# Patient Record
Sex: Female | Born: 1955 | Race: Black or African American | Hispanic: No | Marital: Married | Smoking: Never smoker
Health system: Southern US, Community
[De-identification: ages and names within clinical notes are randomized; demographics above are authoritative.]

## PROBLEM LIST (undated history)

## (undated) HISTORY — PX: CHOLECYSTECTOMY: SHX55

---

## 2004-05-16 ENCOUNTER — Ambulatory Visit (HOSPITAL_COMMUNITY): Admission: RE | Admit: 2004-05-16 | Discharge: 2004-05-16 | Payer: Self-pay | Admitting: Obstetrics and Gynecology

## 2004-06-24 ENCOUNTER — Encounter: Admission: RE | Admit: 2004-06-24 | Discharge: 2004-06-24 | Payer: Self-pay | Admitting: Family Medicine

## 2005-05-17 ENCOUNTER — Ambulatory Visit (HOSPITAL_COMMUNITY): Admission: RE | Admit: 2005-05-17 | Discharge: 2005-05-17 | Payer: Self-pay | Admitting: Obstetrics and Gynecology

## 2005-06-15 ENCOUNTER — Encounter: Admission: RE | Admit: 2005-06-15 | Discharge: 2005-06-15 | Payer: Self-pay | Admitting: Obstetrics and Gynecology

## 2006-07-18 ENCOUNTER — Encounter: Admission: RE | Admit: 2006-07-18 | Discharge: 2006-07-18 | Payer: Self-pay | Admitting: Obstetrics and Gynecology

## 2007-08-02 ENCOUNTER — Encounter: Admission: RE | Admit: 2007-08-02 | Discharge: 2007-08-02 | Payer: Self-pay | Admitting: Obstetrics and Gynecology

## 2008-08-04 ENCOUNTER — Ambulatory Visit: Payer: Self-pay | Admitting: Diagnostic Radiology

## 2008-08-04 ENCOUNTER — Ambulatory Visit (HOSPITAL_BASED_OUTPATIENT_CLINIC_OR_DEPARTMENT_OTHER): Admission: RE | Admit: 2008-08-04 | Discharge: 2008-08-04 | Payer: Self-pay | Admitting: Obstetrics and Gynecology

## 2009-03-17 ENCOUNTER — Ambulatory Visit: Payer: Self-pay | Admitting: Diagnostic Radiology

## 2009-03-17 ENCOUNTER — Emergency Department (HOSPITAL_BASED_OUTPATIENT_CLINIC_OR_DEPARTMENT_OTHER): Admission: EM | Admit: 2009-03-17 | Discharge: 2009-03-17 | Payer: Self-pay | Admitting: Emergency Medicine

## 2009-08-10 ENCOUNTER — Ambulatory Visit: Payer: Self-pay | Admitting: Diagnostic Radiology

## 2009-08-10 ENCOUNTER — Ambulatory Visit (HOSPITAL_BASED_OUTPATIENT_CLINIC_OR_DEPARTMENT_OTHER): Admission: RE | Admit: 2009-08-10 | Discharge: 2009-08-10 | Payer: Self-pay | Admitting: Obstetrics and Gynecology

## 2010-02-13 ENCOUNTER — Encounter: Payer: Self-pay | Admitting: Obstetrics and Gynecology

## 2010-03-23 ENCOUNTER — Encounter (HOSPITAL_COMMUNITY)
Admission: RE | Admit: 2010-03-23 | Discharge: 2010-03-23 | Disposition: A | Discharge status: Home / Self Care | Payer: Managed Care, Other (non HMO) | Source: Ambulatory Visit | Attending: Obstetrics and Gynecology | Admitting: Obstetrics and Gynecology

## 2010-03-23 DIAGNOSIS — Z01812 Encounter for preprocedural laboratory examination: Secondary | ICD-10-CM | POA: Insufficient documentation

## 2010-03-23 LAB — SURGICAL PCR SCREEN: Staphylococcus aureus: INVALID — AB

## 2010-03-23 LAB — CBC
MCHC: 32.2 g/dL (ref 30.0–36.0)
Platelets: 335 10*3/uL (ref 150–400)
WBC: 4.7 10*3/uL (ref 4.0–10.5)

## 2010-03-26 LAB — MRSA CULTURE

## 2010-03-28 ENCOUNTER — Other Ambulatory Visit: Payer: Self-pay | Admitting: Obstetrics and Gynecology

## 2010-03-28 ENCOUNTER — Inpatient Hospital Stay (HOSPITAL_COMMUNITY)
Admission: RE | Admit: 2010-03-28 | Discharge: 2010-03-29 | DRG: 743 | Disposition: A | Discharge status: Home / Self Care | Payer: Managed Care, Other (non HMO) | Source: Ambulatory Visit | Attending: Obstetrics and Gynecology | Admitting: Obstetrics and Gynecology

## 2010-03-28 DIAGNOSIS — D252 Subserosal leiomyoma of uterus: Secondary | ICD-10-CM | POA: Diagnosis present

## 2010-03-28 DIAGNOSIS — D649 Anemia, unspecified: Secondary | ICD-10-CM | POA: Diagnosis present

## 2010-03-28 DIAGNOSIS — D251 Intramural leiomyoma of uterus: Principal | ICD-10-CM | POA: Diagnosis present

## 2010-03-28 DIAGNOSIS — N920 Excessive and frequent menstruation with regular cycle: Secondary | ICD-10-CM | POA: Diagnosis present

## 2010-03-28 LAB — CBC
Hemoglobin: 11 g/dL — ABNORMAL LOW (ref 12.0–15.0)
MCH: 30.1 pg (ref 26.0–34.0)
MCHC: 31.9 g/dL (ref 30.0–36.0)
Platelets: 279 10*3/uL (ref 150–400)
RBC: 3.66 MIL/uL — ABNORMAL LOW (ref 3.87–5.11)

## 2010-03-29 LAB — CBC
HCT: 31.6 % — ABNORMAL LOW (ref 36.0–46.0)
MCH: 30.2 pg (ref 26.0–34.0)
MCV: 93.5 fL (ref 78.0–100.0)
RBC: 3.38 MIL/uL — ABNORMAL LOW (ref 3.87–5.11)
WBC: 9.3 10*3/uL (ref 4.0–10.5)

## 2010-03-29 NOTE — Op Note (Addendum)
NAME:  Debbie Duncan, Debbie Duncan NO.:  [id number]  MEDICAL RECORD NO.:  [id number]           PATIENT TYPE:  I  LOCATION:  9317                          FACILITY:  [facility]  PHYSICIAN:  Arlyce Harman, MD     DATE OF BIRTH:  [birth year]  DATE OF PROCEDURE:  03/28/2010 DATE OF DISCHARGE:                              OPERATIVE REPORT   PREOPERATIVE DIAGNOSES:  Fibroids, menorrhagia, anemia.  POSTOPERATIVE DIAGNOSES:  Fibroids, menorrhagia, anemia.  SURGEON:  Arlyce Harman, MD  ASSISTANT:  Pieter Partridge, MD.  COMPLICATIONS:  None.  PROCEDURE:  Abdominal hysterectomy and examine under anesthesia.  FINDINGS:  Enlarged fibroid uterus.  SPECIMENS:  Uterus and cervix.  DISPOSITION:  Sent to Pathology.  ESTIMATED BLOOD LOSS:  200-400 mL.  INDICATIONS AND CONSENT:  The patient is a 55 year old parous female who has an enlarging fibroid uterus.  In addition she has had worsening menorrhagia and is anemic.  Hemoglobin was 9 on last week and the patient was started on high doses of iron and iron rich diet.  Preop hemoglobin was 11.  Options for treatment were thoroughly discussed with the patient, and the patient gave informed consent for total vaginal hysterectomy versus total abdominal hysterectomy.  Plan is to do exam under anesthesia before surgery and then determine which would be the best operative procedure.  The patient gave informed consent for both procedures.  PROCEDURE:  The patient was placed on the table in a supine position. General anesthesia was induced.  She was then placed in the dorsal lithotomy position, and the Foley catheter was inserted after the patient had been prepped and draped in the usual fashion.  A speculum was inserted into the vagina and the cervix presented, attempt was made to pull the cervix down, and it did not come all the way down to the introitus.  There was very little prolapse.  A bimanual was performed and the uterus was  palpated as to approximately 10 to 12 cm and retroverted, I could not feel the top of the fundus.  Therefore, the decision was made to proceed abdominally.  At this point, the single-tooth tenaculum was removed from the cervix, and the patient was placed in a supine position and then we changed gloves and went above.  The abdomen was entered using a Pfannenstiel technique, and the abdomen was entered carefully without difficulty cauterizing the small bleeders to the sac.  The patient is a TE[employer] teacher [employer] and refuses whole blood; however, she did consent for fragments if medically necessary.  The abdomen was entered without difficulty and the enlarged uterus did present and it was retroverted, large and globular.  The bowel was packed away and a suture was placed in the uterine fundus for traction and the left round ligament was identified and a Tresa Endo was placed on the proximal portion followed by a curved Haney.  The pedicle was incised using the Bovie, and then it was tied using 0 Vicryl.  Next, the anterior leaf of the broad ligament was entered and reflected downward to push the bladder down anteriorly.  The incision was extended using the  Metzenbaum scissors to the other side of the uterus and then the other round ligament was clamped, cut, and tied in a similar fashion, and the bladder was reflected downward.  The adnexal pedicle on the left was then clamped, cut, and doubly tied, followed by the right side, and we continued to work our way down until we got to the uterine vessels which were clamped, cut, and tied bilaterally.  Moist straight Heaney was placed on the cardinal ligaments and these were clamped, cut, and tied bilaterally and then a curved Haney was placed at the margin of the cervix bilaterally and the cuff was incised and using the Jorgenson scissors, the cuff was completely incised to free up the uterus and the cervix and this was handed off.  Richardson angle  stitches were placed bilaterally and then the cuff was closed using interrupted figure-of- eight sutures.  The abdomen was then irrigated and suctioned out. Hemostasis was excellent.  At this point, a suture was placed in the top of the cuff.  The bladder plicated to cover, the bladder flap was used to cover the exposed vaginal cuff incision and support the cul-de-sac. The pelvis was deep with an enlarged uterus, retroverted prior to its removal.  Again, hemostasis was noted as excellent.  On the left side, the adnexal pedicle was plicated to the round ligament.  This was not done on the right because the adnexal pedicle was well supported on that side.  At this point, all the instruments were removed and pedicles checked which were dry then the peritoneum was then closed using 0 Vicryl in a running fashion.  Sponge and instrument counts were correct. Next, the fascia was closed using 0 Vicryl in a running locked fashion. The subcu was closed with 0 plain in a running fashion, and the skin was approximated with 4-0 Vicryl sewing in a subcuticular fashion.  At this point, the procedure was terminated.  The patient was awakened and taken to recovery in good condition.     Arlyce Harman, MD     EG/MEDQ  D:  03/28/2010  T:  03/29/2010  Job:  [id number]  Electronically Signed by Arlyce Harman MD on 03/29/2010 07:43:20 PM

## 2010-04-13 LAB — CBC
Hemoglobin: 10.2 g/dL — ABNORMAL LOW (ref 12.0–15.0)
MCHC: 33.6 g/dL (ref 30.0–36.0)
Platelets: 308 10*3/uL (ref 150–400)
RBC: 3.49 MIL/uL — ABNORMAL LOW (ref 3.87–5.11)

## 2010-04-13 LAB — COMPREHENSIVE METABOLIC PANEL
AST: 55 U/L — ABNORMAL HIGH (ref 0–37)
Alkaline Phosphatase: 73 U/L (ref 39–117)
CO2: 27 mEq/L (ref 19–32)
Calcium: 9.3 mg/dL (ref 8.4–10.5)
Chloride: 105 mEq/L (ref 96–112)
Creatinine, Ser: 0.9 mg/dL (ref 0.4–1.2)
Glucose, Bld: 83 mg/dL (ref 70–99)
Potassium: 5.4 mEq/L — ABNORMAL HIGH (ref 3.5–5.1)
Total Bilirubin: 0.9 mg/dL (ref 0.3–1.2)
Total Protein: 8.5 g/dL — ABNORMAL HIGH (ref 6.0–8.3)

## 2010-04-13 LAB — DIFFERENTIAL
Basophils Relative: 2 % — ABNORMAL HIGH (ref 0–1)
Monocytes Absolute: 0.4 10*3/uL (ref 0.1–1.0)

## 2010-04-13 LAB — POCT CARDIAC MARKERS
Myoglobin, poc: 43.4 ng/mL (ref 12–200)
Troponin i, poc: <0.05 ng/mL (ref 0.00–0.09)

## 2010-07-26 ENCOUNTER — Other Ambulatory Visit: Payer: Self-pay | Admitting: Obstetrics and Gynecology

## 2010-07-26 DIAGNOSIS — Z1231 Encounter for screening mammogram for malignant neoplasm of breast: Secondary | ICD-10-CM

## 2010-08-12 ENCOUNTER — Ambulatory Visit (HOSPITAL_BASED_OUTPATIENT_CLINIC_OR_DEPARTMENT_OTHER)
Admission: RE | Admit: 2010-08-12 | Discharge: 2010-08-12 | Disposition: A | Discharge status: Home / Self Care | Payer: Managed Care, Other (non HMO) | Source: Ambulatory Visit | Attending: Obstetrics and Gynecology | Admitting: Obstetrics and Gynecology

## 2010-08-12 DIAGNOSIS — Z1231 Encounter for screening mammogram for malignant neoplasm of breast: Secondary | ICD-10-CM | POA: Insufficient documentation

## 2011-07-31 ENCOUNTER — Other Ambulatory Visit: Payer: Self-pay | Admitting: Obstetrics and Gynecology

## 2011-07-31 DIAGNOSIS — Z1231 Encounter for screening mammogram for malignant neoplasm of breast: Secondary | ICD-10-CM

## 2011-08-14 ENCOUNTER — Ambulatory Visit (HOSPITAL_BASED_OUTPATIENT_CLINIC_OR_DEPARTMENT_OTHER): Payer: Managed Care, Other (non HMO)

## 2011-08-16 ENCOUNTER — Ambulatory Visit (HOSPITAL_BASED_OUTPATIENT_CLINIC_OR_DEPARTMENT_OTHER)
Admission: RE | Admit: 2011-08-16 | Discharge: 2011-08-16 | Disposition: A | Discharge status: Home / Self Care | Payer: Managed Care, Other (non HMO) | Source: Ambulatory Visit | Attending: Obstetrics and Gynecology | Admitting: Obstetrics and Gynecology

## 2011-08-16 DIAGNOSIS — Z1231 Encounter for screening mammogram for malignant neoplasm of breast: Secondary | ICD-10-CM | POA: Insufficient documentation

## 2012-07-29 ENCOUNTER — Ambulatory Visit (HOSPITAL_BASED_OUTPATIENT_CLINIC_OR_DEPARTMENT_OTHER)
Admission: RE | Admit: 2012-07-29 | Discharge: 2012-07-29 | Disposition: A | Discharge status: Home / Self Care | Payer: Managed Care, Other (non HMO) | Source: Ambulatory Visit | Attending: Obstetrics and Gynecology | Admitting: Obstetrics and Gynecology

## 2012-07-29 ENCOUNTER — Other Ambulatory Visit (HOSPITAL_BASED_OUTPATIENT_CLINIC_OR_DEPARTMENT_OTHER): Payer: Self-pay | Admitting: *Deleted

## 2012-07-29 ENCOUNTER — Other Ambulatory Visit: Payer: Self-pay | Admitting: Obstetrics and Gynecology

## 2012-07-29 DIAGNOSIS — Z1231 Encounter for screening mammogram for malignant neoplasm of breast: Secondary | ICD-10-CM

## 2012-07-29 DIAGNOSIS — N189 Chronic kidney disease, unspecified: Secondary | ICD-10-CM | POA: Insufficient documentation

## 2012-07-29 DIAGNOSIS — N182 Chronic kidney disease, stage 2 (mild): Secondary | ICD-10-CM

## 2012-08-19 ENCOUNTER — Ambulatory Visit (HOSPITAL_BASED_OUTPATIENT_CLINIC_OR_DEPARTMENT_OTHER)
Admission: RE | Admit: 2012-08-19 | Discharge: 2012-08-19 | Disposition: A | Discharge status: Home / Self Care | Payer: Managed Care, Other (non HMO) | Source: Ambulatory Visit | Attending: Obstetrics and Gynecology | Admitting: Obstetrics and Gynecology

## 2012-08-19 DIAGNOSIS — Z1231 Encounter for screening mammogram for malignant neoplasm of breast: Secondary | ICD-10-CM | POA: Insufficient documentation

## 2013-08-13 ENCOUNTER — Other Ambulatory Visit: Payer: Self-pay | Admitting: Obstetrics and Gynecology

## 2013-08-13 DIAGNOSIS — Z1231 Encounter for screening mammogram for malignant neoplasm of breast: Secondary | ICD-10-CM

## 2013-08-20 ENCOUNTER — Ambulatory Visit (HOSPITAL_BASED_OUTPATIENT_CLINIC_OR_DEPARTMENT_OTHER)
Admission: RE | Admit: 2013-08-20 | Discharge: 2013-08-20 | Disposition: A | Discharge status: Home / Self Care | Payer: Managed Care, Other (non HMO) | Source: Ambulatory Visit | Attending: Obstetrics and Gynecology | Admitting: Obstetrics and Gynecology

## 2013-08-20 DIAGNOSIS — Z1231 Encounter for screening mammogram for malignant neoplasm of breast: Secondary | ICD-10-CM

## 2014-10-08 ENCOUNTER — Other Ambulatory Visit (HOSPITAL_BASED_OUTPATIENT_CLINIC_OR_DEPARTMENT_OTHER): Payer: Self-pay | Admitting: Family Medicine

## 2014-10-08 DIAGNOSIS — Z1231 Encounter for screening mammogram for malignant neoplasm of breast: Secondary | ICD-10-CM

## 2014-10-12 ENCOUNTER — Ambulatory Visit (HOSPITAL_BASED_OUTPATIENT_CLINIC_OR_DEPARTMENT_OTHER)
Admission: RE | Admit: 2014-10-12 | Discharge: 2014-10-12 | Disposition: A | Discharge status: Home / Self Care | Payer: BLUE CROSS/BLUE SHIELD | Source: Ambulatory Visit | Attending: Family Medicine | Admitting: Family Medicine

## 2014-10-12 DIAGNOSIS — Z1231 Encounter for screening mammogram for malignant neoplasm of breast: Secondary | ICD-10-CM | POA: Insufficient documentation

## 2015-10-12 ENCOUNTER — Other Ambulatory Visit: Payer: Self-pay | Admitting: Obstetrics and Gynecology

## 2015-10-12 DIAGNOSIS — Z1231 Encounter for screening mammogram for malignant neoplasm of breast: Secondary | ICD-10-CM

## 2015-10-19 ENCOUNTER — Ambulatory Visit (HOSPITAL_BASED_OUTPATIENT_CLINIC_OR_DEPARTMENT_OTHER)
Admission: RE | Admit: 2015-10-19 | Discharge: 2015-10-19 | Disposition: A | Discharge status: Home / Self Care | Payer: BLUE CROSS/BLUE SHIELD | Source: Ambulatory Visit | Attending: Obstetrics and Gynecology | Admitting: Obstetrics and Gynecology

## 2015-10-19 DIAGNOSIS — Z1231 Encounter for screening mammogram for malignant neoplasm of breast: Secondary | ICD-10-CM | POA: Diagnosis not present

## 2015-10-19 DIAGNOSIS — R928 Other abnormal and inconclusive findings on diagnostic imaging of breast: Secondary | ICD-10-CM | POA: Diagnosis not present

## 2015-10-25 ENCOUNTER — Other Ambulatory Visit: Payer: Self-pay | Admitting: Obstetrics and Gynecology

## 2015-10-25 DIAGNOSIS — R928 Other abnormal and inconclusive findings on diagnostic imaging of breast: Secondary | ICD-10-CM

## 2015-10-28 ENCOUNTER — Ambulatory Visit
Admission: RE | Admit: 2015-10-28 | Discharge: 2015-10-28 | Disposition: A | Discharge status: Home / Self Care | Payer: BLUE CROSS/BLUE SHIELD | Source: Ambulatory Visit | Attending: Obstetrics and Gynecology | Admitting: Obstetrics and Gynecology

## 2015-10-28 DIAGNOSIS — R928 Other abnormal and inconclusive findings on diagnostic imaging of breast: Secondary | ICD-10-CM

## 2016-10-30 ENCOUNTER — Other Ambulatory Visit: Payer: Self-pay | Admitting: Obstetrics and Gynecology

## 2016-10-30 DIAGNOSIS — Z1231 Encounter for screening mammogram for malignant neoplasm of breast: Secondary | ICD-10-CM

## 2016-11-02 ENCOUNTER — Ambulatory Visit (HOSPITAL_BASED_OUTPATIENT_CLINIC_OR_DEPARTMENT_OTHER): Payer: BLUE CROSS/BLUE SHIELD

## 2016-11-16 ENCOUNTER — Ambulatory Visit (HOSPITAL_BASED_OUTPATIENT_CLINIC_OR_DEPARTMENT_OTHER)
Admission: RE | Admit: 2016-11-16 | Discharge: 2016-11-16 | Disposition: A | Discharge status: Home / Self Care | Payer: BLUE CROSS/BLUE SHIELD | Source: Ambulatory Visit | Attending: Obstetrics and Gynecology | Admitting: Obstetrics and Gynecology

## 2016-11-16 DIAGNOSIS — Z1231 Encounter for screening mammogram for malignant neoplasm of breast: Secondary | ICD-10-CM | POA: Diagnosis present

## 2017-10-10 ENCOUNTER — Other Ambulatory Visit: Payer: Self-pay | Admitting: Obstetrics and Gynecology

## 2017-10-10 DIAGNOSIS — Z1231 Encounter for screening mammogram for malignant neoplasm of breast: Secondary | ICD-10-CM

## 2017-11-20 ENCOUNTER — Ambulatory Visit (HOSPITAL_BASED_OUTPATIENT_CLINIC_OR_DEPARTMENT_OTHER)
Admission: RE | Admit: 2017-11-20 | Discharge: 2017-11-20 | Disposition: A | Discharge status: Home / Self Care | Payer: BLUE CROSS/BLUE SHIELD | Source: Ambulatory Visit | Attending: Obstetrics and Gynecology | Admitting: Obstetrics and Gynecology

## 2017-11-20 DIAGNOSIS — Z1231 Encounter for screening mammogram for malignant neoplasm of breast: Secondary | ICD-10-CM | POA: Diagnosis present

## 2018-10-29 ENCOUNTER — Other Ambulatory Visit (HOSPITAL_BASED_OUTPATIENT_CLINIC_OR_DEPARTMENT_OTHER): Payer: Self-pay | Admitting: Family Medicine

## 2018-10-29 DIAGNOSIS — Z1231 Encounter for screening mammogram for malignant neoplasm of breast: Secondary | ICD-10-CM

## 2018-11-26 ENCOUNTER — Other Ambulatory Visit: Payer: Self-pay

## 2018-11-26 ENCOUNTER — Ambulatory Visit (HOSPITAL_BASED_OUTPATIENT_CLINIC_OR_DEPARTMENT_OTHER)
Admission: RE | Admit: 2018-11-26 | Discharge: 2018-11-26 | Disposition: A | Discharge status: Home / Self Care | Payer: BC Managed Care – PPO | Source: Ambulatory Visit | Attending: Family Medicine | Admitting: Family Medicine

## 2018-11-26 DIAGNOSIS — Z1231 Encounter for screening mammogram for malignant neoplasm of breast: Secondary | ICD-10-CM | POA: Diagnosis not present

## 2018-12-30 ENCOUNTER — Emergency Department (HOSPITAL_BASED_OUTPATIENT_CLINIC_OR_DEPARTMENT_OTHER)
Admission: EM | Admit: 2018-12-30 | Discharge: 2018-12-30 | Disposition: A | Discharge status: Home / Self Care | Payer: BC Managed Care – PPO | Attending: Emergency Medicine | Admitting: Emergency Medicine

## 2018-12-30 ENCOUNTER — Encounter (HOSPITAL_BASED_OUTPATIENT_CLINIC_OR_DEPARTMENT_OTHER): Payer: Self-pay

## 2018-12-30 ENCOUNTER — Emergency Department (HOSPITAL_BASED_OUTPATIENT_CLINIC_OR_DEPARTMENT_OTHER): Payer: BC Managed Care – PPO

## 2018-12-30 ENCOUNTER — Other Ambulatory Visit: Payer: Self-pay

## 2018-12-30 DIAGNOSIS — U071 COVID-19: Secondary | ICD-10-CM | POA: Insufficient documentation

## 2018-12-30 DIAGNOSIS — R519 Headache, unspecified: Secondary | ICD-10-CM | POA: Diagnosis not present

## 2018-12-30 DIAGNOSIS — R5383 Other fatigue: Secondary | ICD-10-CM | POA: Insufficient documentation

## 2018-12-30 DIAGNOSIS — R0602 Shortness of breath: Secondary | ICD-10-CM | POA: Insufficient documentation

## 2018-12-30 DIAGNOSIS — Z79899 Other long term (current) drug therapy: Secondary | ICD-10-CM | POA: Diagnosis not present

## 2018-12-30 DIAGNOSIS — R42 Dizziness and giddiness: Secondary | ICD-10-CM | POA: Diagnosis present

## 2018-12-30 LAB — CBC WITH DIFFERENTIAL/PLATELET
Abs Immature Granulocytes: 0 10*3/uL (ref 0.00–0.07)
Basophils Absolute: 0 10*3/uL (ref 0.0–0.1)
Basophils Relative: 1 %
Eosinophils Absolute: 0 10*3/uL (ref 0.0–0.5)
Eosinophils Relative: 0 %
HCT: 44.2 % (ref 36.0–46.0)
Hemoglobin: 14.9 g/dL (ref 12.0–15.0)
Immature Granulocytes: 0 %
Lymphocytes Relative: 26 %
Lymphs Abs: 1.4 10*3/uL (ref 0.7–4.0)
MCH: 32.7 pg (ref 26.0–34.0)
MCHC: 33.7 g/dL (ref 30.0–36.0)
MCV: 97.1 fL (ref 80.0–100.0)
Monocytes Absolute: 0.4 10*3/uL (ref 0.1–1.0)
Monocytes Relative: 7 %
Neutro Abs: 3.7 10*3/uL (ref 1.7–7.7)
Neutrophils Relative %: 66 %
Platelets: 250 10*3/uL (ref 150–400)
RBC: 4.55 MIL/uL (ref 3.87–5.11)
RDW: 11.9 % (ref 11.5–15.5)
WBC: 5.6 10*3/uL (ref 4.0–10.5)
nRBC: 0 % (ref 0.0–0.2)

## 2018-12-30 LAB — BASIC METABOLIC PANEL
Anion gap: 9 (ref 5–15)
BUN: 15 mg/dL (ref 6–20)
CO2: 24 mmol/L (ref 22–32)
Calcium: 9.9 mg/dL (ref 8.9–10.3)
Chloride: 106 mmol/L (ref 98–111)
Creatinine, Ser: 1.01 mg/dL — ABNORMAL HIGH (ref 0.44–1.00)
GFR calc Af Amer: >60 mL/min (ref >60)
GFR calc non Af Amer: >60 mL/min (ref >60)
Glucose, Bld: 97 mg/dL (ref 70–99)
Potassium: 3.7 mmol/L (ref 3.5–5.1)
Sodium: 139 mmol/L (ref 135–145)

## 2018-12-30 LAB — URINALYSIS, ROUTINE W REFLEX MICROSCOPIC
Bilirubin Urine: NEGATIVE
Glucose, UA: NEGATIVE mg/dL
Hgb urine dipstick: NEGATIVE
Ketones, ur: NEGATIVE mg/dL
Nitrite: NEGATIVE
Protein, ur: NEGATIVE mg/dL
Specific Gravity, Urine: 1.015 (ref 1.005–1.030)
pH: 6.5 (ref 5.0–8.0)

## 2018-12-30 LAB — URINALYSIS, MICROSCOPIC (REFLEX): RBC / HPF: NONE SEEN RBC/hpf (ref 0–5)

## 2018-12-30 MED ORDER — SODIUM CHLORIDE 0.9 % IV BOLUS
1000.0000 mL | Freq: Once | INTRAVENOUS | Status: AC
  Administered 2018-12-30: 16:00:00 1000 mL via INTRAVENOUS

## 2018-12-30 NOTE — ED Provider Notes (Signed)
[facility] Provider Note   CSN: [id number] Arrival date & time: 12/30/18  1309     History   Chief Complaint Chief Complaint  Patient presents with  . Shortness of Breath    COVID +    HPI Debbie Duncan is a 63 y.o. female.  She is presented today complaining of feeling lightheaded fatigued and short of breath.  She began being sick about 5 days ago and has had a positive Covid test as of 3 days ago.  She was having headaches which are improved.  Sore throat improved.  Felt more dizzy lightheaded today so wanted to get checked out.  Dry cough.     The history is provided by the patient.  Shortness of Breath Severity:  Moderate Onset quality:  Gradual Timing:  Constant Progression:  Worsening Chronicity:  New Relieved by:  Rest Worsened by:  Activity Ineffective treatments:  None tried Associated symptoms: cough, fever, headaches and sore throat   Associated symptoms: no abdominal pain, no chest pain, no diaphoresis, no hemoptysis, no rash, no sputum production, no syncope, no vomiting and no wheezing     History reviewed. No pertinent past medical history.  There are no active problems to display for this patient.   Past Surgical History:  Procedure Laterality Date  . CHOLECYSTECTOMY       OB History   No obstetric history on file.      Home Medications    Prior to Admission medications   Medication Sig Start Date End Date Taking? Authorizing Provider  pantoprazole (PROTONIX) 20 MG tablet Take by mouth. 09/22/15  Yes [provider]  atorvastatin (LIPITOR) 10 MG tablet Take 10 mg by mouth at bedtime. 09/27/18   [provider]  loratadine (CLARITIN) 10 MG tablet Take 10 mg by mouth daily. 07/26/18   [provider]    Family History History reviewed. No pertinent family history.  Social History Social History   Tobacco Use  . Smoking status: Never Smoker  . Smokeless tobacco: Never Used   Substance Use Topics  . Alcohol use: Never    Frequency: Never  . Drug use: Never     Allergies   Patient has no known allergies.   Review of Systems Review of Systems  Constitutional: Positive for fever. Negative for diaphoresis.  HENT: Positive for sore throat.   Eyes: Negative for visual disturbance.  Respiratory: Positive for cough and shortness of breath. Negative for hemoptysis, sputum production and wheezing.   Cardiovascular: Negative for chest pain and syncope.  Gastrointestinal: Negative for abdominal pain and vomiting.  Genitourinary: Negative for dysuria.  Musculoskeletal: Positive for back pain.  Skin: Negative for rash.  Neurological: Positive for headaches.     Physical Exam Updated Vital Signs BP (!) 165/111 (BP Location: Right Arm)   Pulse (!) 111   Temp 98.5 F (36.9 C) (Oral)   Resp 20   Ht 5\' 6"  (1.676 m)   Wt 65.6 kg   SpO2 100%   BMI 23.33 kg/m   Physical Exam Vitals signs and nursing note reviewed.  Constitutional:      General: She is not in acute distress.    Appearance: She is well-developed.  HENT:     Head: Normocephalic and atraumatic.  Eyes:     Conjunctiva/sclera: Conjunctivae normal.  Neck:     Musculoskeletal: Neck supple.  Cardiovascular:     Rate and Rhythm: Regular rhythm. Tachycardia present.     Heart sounds:  No murmur.  Pulmonary:     Effort: Pulmonary effort is normal. No respiratory distress.     Breath sounds: Normal breath sounds.  Abdominal:     Palpations: Abdomen is soft.     Tenderness: There is no abdominal tenderness.  Musculoskeletal: Normal range of motion.        General: No tenderness.     Right lower leg: She exhibits no tenderness. No edema.     Left lower leg: She exhibits no tenderness. No edema.  Skin:    General: Skin is warm and dry.     Capillary Refill: Capillary refill takes less than 2 seconds.  Neurological:     General: No focal deficit present.     Mental Status: She is alert.      Gait: Gait normal.      ED Treatments / Results  Labs (all labs ordered are listed, but only abnormal results are displayed) Labs Reviewed  BASIC METABOLIC PANEL - Abnormal; Notable for the following components:      Result Value   Creatinine, Ser 1.01 (*)    All other components within normal limits  URINALYSIS, ROUTINE W REFLEX MICROSCOPIC - Abnormal; Notable for the following components:   Leukocytes,Ua TRACE (*)    All other components within normal limits  URINALYSIS, MICROSCOPIC (REFLEX) - Abnormal; Notable for the following components:   Bacteria, UA RARE (*)    All other components within normal limits  CBC WITH DIFFERENTIAL/PLATELET    EKG EKG Interpretation  Date/Time:  Monday December 30 2018 15:30:46 EST Ventricular Rate:  83 PR Interval:    QRS Duration: 81 QT Interval:  364 QTC Calculation: 428 R Axis:   75 Text Interpretation: Sinus rhythm similar to prior 2/11 Confirmed by Aletta Edouard [phone]) on 12/30/2018 3:37:56 PM   Radiology Dg Chest Portable 1 View  Result Date: 12/30/2018 CLINICAL DATA:  Shortness of breath. EXAM: PORTABLE CHEST 1 VIEW COMPARISON:  August 06, 2018. FINDINGS: The heart size and mediastinal contours are within normal limits. Both lungs are clear. No pneumothorax or pleural effusion is noted. The visualized skeletal structures are unremarkable. IMPRESSION: No active disease. Electronically Signed   By: Marijo Conception M.D.   On: 12/30/2018 15:18    Procedures Procedures (including critical care time)  Medications Ordered in ED Medications  sodium chloride 0.9 % bolus 1,000 mL (has no administration in time range)     Initial Impression / Assessment and Plan / ED Course  I have reviewed the triage vital signs and the nursing notes.  Pertinent labs & imaging results that were available during my care of the patient were reviewed by me and considered in my medical decision making (see chart for details).  Clinical Course as of  Dec 30 1136  Mon Dec 30, 5119  4341 63 year old female with no significant past medical history here positive with Covid complaining of increased fatigue shortness of breath.  Tachycardic here but saturations 100% on room air.  Speaking in full sentences.  Will get some screening labs chest x-ray and give her some IV fluids.  Differential includes Covid, Covid pneumonia, less likely PE, dehydration.   [MB]  [id number] Chest x-ray interpreted by me as no gross infiltrates.   [MB]  1722 I reviewed the results with the patient.  She remains satting 100% on room air and heart rate is come down with some IV fluids.  She understands to isolate and to return if any acute worsening of her symptoms.   [  MB]    Clinical Course User Index [MB] Hayden Rasmussen, MD   KAMARIE ROCKER was evaluated in Emergency Department on 12/30/2018 for the symptoms described in the history of present illness. She was evaluated in the context of the global COVID-19 pandemic, which necessitated consideration that the patient might be at risk for infection with the SARS-CoV-2 virus that causes COVID-19. Institutional protocols and algorithms that pertain to the evaluation of patients at risk for COVID-19 are in a state of rapid change based on information released by regulatory bodies including the CDC and federal and state organizations. These policies and algorithms were followed during the patient's care in the ED.      Final Clinical Impressions(s) / ED Diagnoses   Final diagnoses:  COVID-19 virus infection  Shortness of breath    ED Discharge Orders    None       Hayden Rasmussen, MD 12/31/18 1138

## 2018-12-30 NOTE — ED Triage Notes (Signed)
Pt reports that she has ben diagnosed with covid about a week ago and has increased SOB and fatigue.

## 2018-12-30 NOTE — Discharge Instructions (Signed)
You were seen in the emergency department for shortness of breath and fatigue in the setting of a Covid infection.  Your chest x-ray did not show any pneumonia and your oxygen saturation was good.  Please continue to try to stay well-hydrated and rest.  Return to the emergency department if any acute worsening of your symptoms.  You should isolate until your symptoms have resolved.

## 2019-10-23 ENCOUNTER — Other Ambulatory Visit (HOSPITAL_BASED_OUTPATIENT_CLINIC_OR_DEPARTMENT_OTHER): Payer: Self-pay | Admitting: Family Medicine

## 2019-10-23 DIAGNOSIS — Z1231 Encounter for screening mammogram for malignant neoplasm of breast: Secondary | ICD-10-CM

## 2019-12-01 ENCOUNTER — Other Ambulatory Visit: Payer: Self-pay

## 2019-12-01 ENCOUNTER — Ambulatory Visit (HOSPITAL_BASED_OUTPATIENT_CLINIC_OR_DEPARTMENT_OTHER)
Admission: RE | Admit: 2019-12-01 | Discharge: 2019-12-01 | Disposition: A | Discharge status: Home / Self Care | Payer: BC Managed Care – PPO | Source: Ambulatory Visit | Attending: Family Medicine | Admitting: Family Medicine

## 2019-12-01 ENCOUNTER — Encounter (HOSPITAL_BASED_OUTPATIENT_CLINIC_OR_DEPARTMENT_OTHER): Payer: Self-pay

## 2019-12-01 DIAGNOSIS — Z1231 Encounter for screening mammogram for malignant neoplasm of breast: Secondary | ICD-10-CM | POA: Insufficient documentation

## 2020-03-01 IMAGING — DX DG CHEST 1V PORT
2 series · 2 of 2 positions shown · non-contrast
Comparison: August 06, 2018.

CLINICAL DATA: Shortness of breath.

EXAM:
PORTABLE CHEST 1 VIEW

[chest ap (1 of 2)]
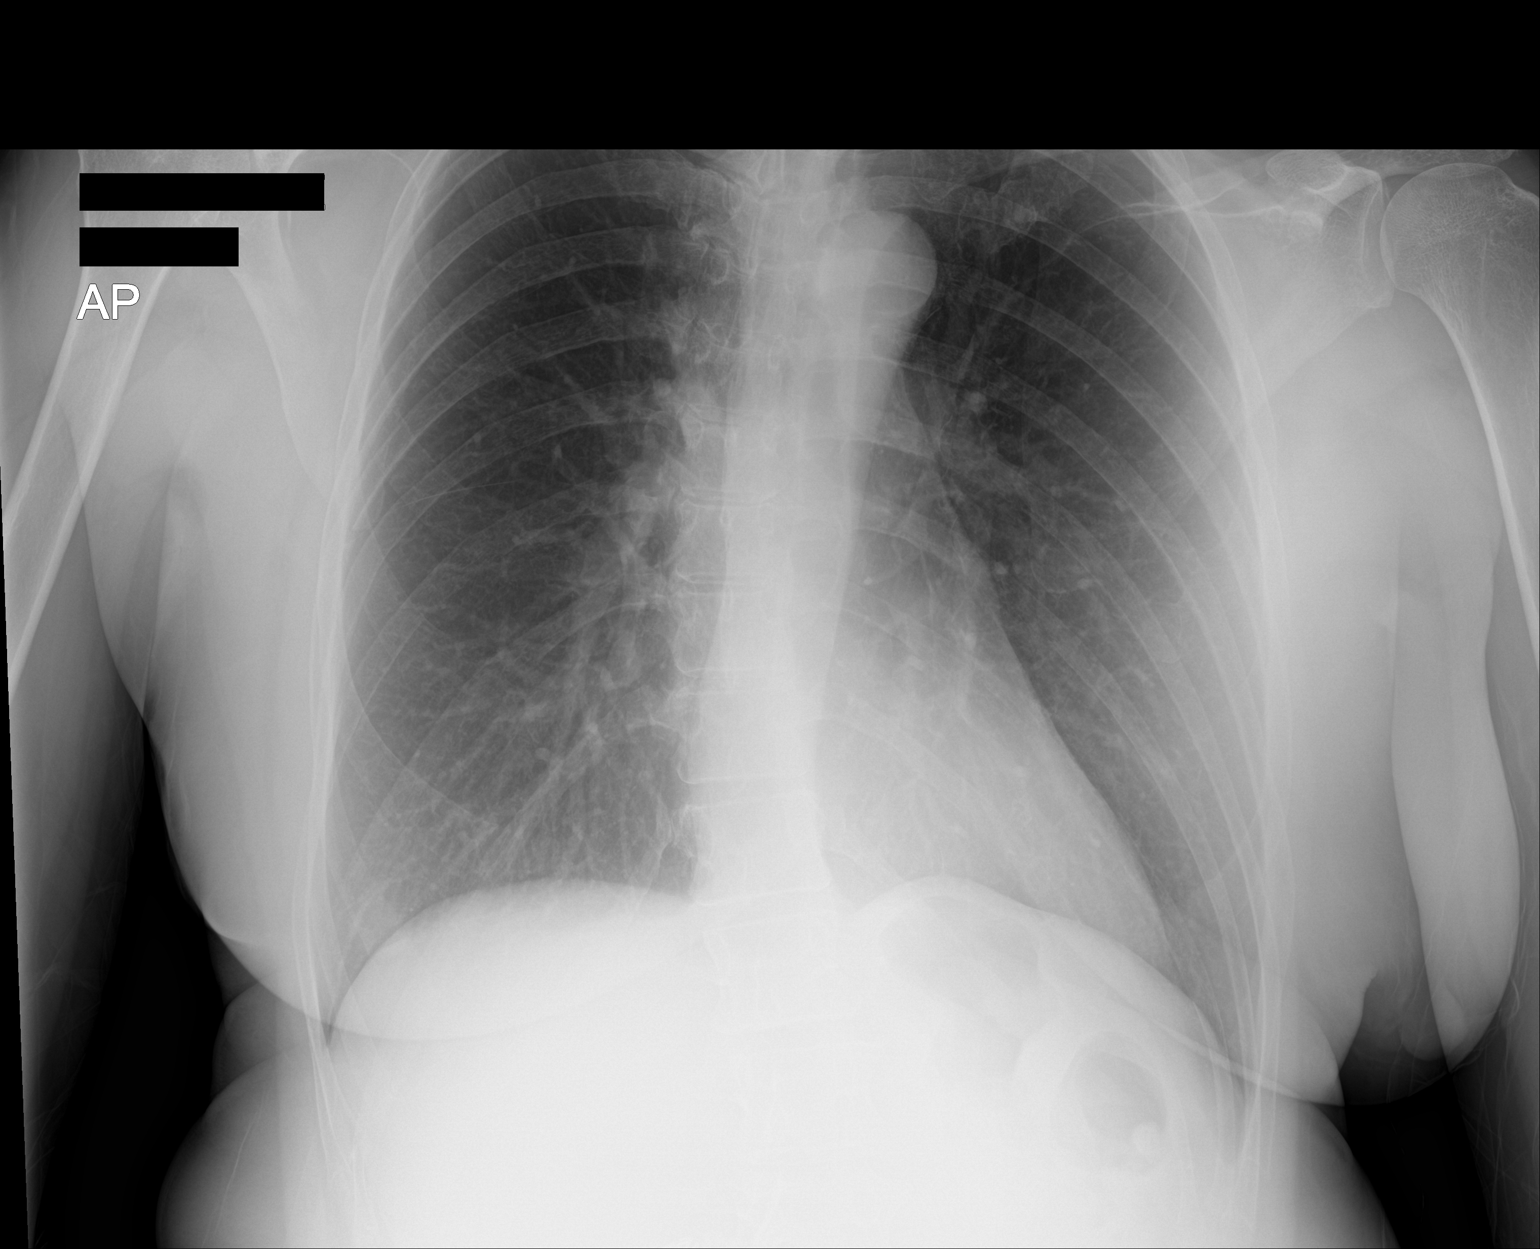

[chest ap (2 of 2)]
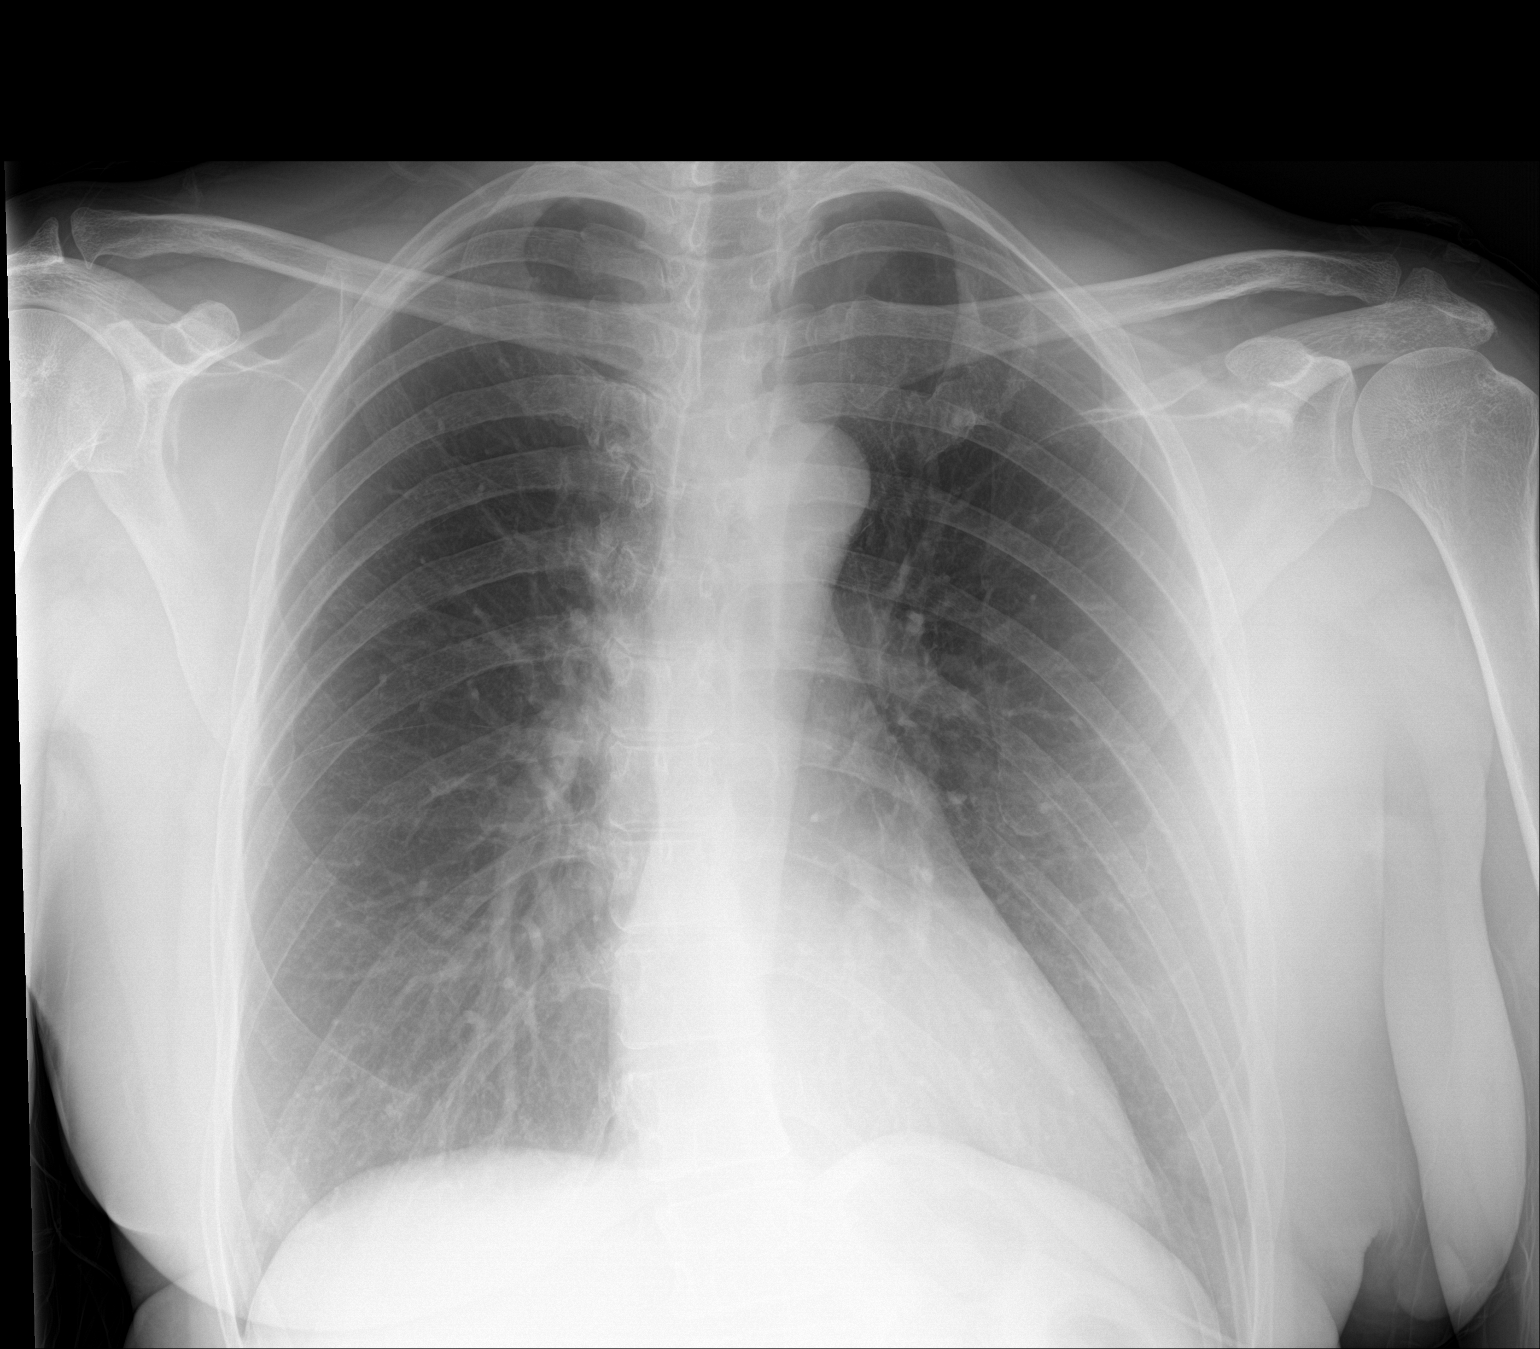

[2 of 2 positions shown; findings below may reference images not displayed]

FINDINGS: The heart size and mediastinal contours are within normal limits.
Both lungs are clear. No pneumothorax or pleural effusion is noted.
The visualized skeletal structures are unremarkable.
IMPRESSION: No active disease.

## 2020-11-02 ENCOUNTER — Other Ambulatory Visit (HOSPITAL_BASED_OUTPATIENT_CLINIC_OR_DEPARTMENT_OTHER): Payer: Self-pay | Admitting: Family Medicine

## 2020-11-02 DIAGNOSIS — Z1231 Encounter for screening mammogram for malignant neoplasm of breast: Secondary | ICD-10-CM

## 2020-11-25 ENCOUNTER — Ambulatory Visit
Admission: RE | Admit: 2020-11-25 | Discharge: 2020-11-25 | Disposition: A | Discharge status: Home / Self Care | Payer: Worker's Compensation | Source: Ambulatory Visit | Attending: Family Medicine | Admitting: Family Medicine

## 2020-11-25 ENCOUNTER — Other Ambulatory Visit: Payer: Self-pay | Admitting: Family Medicine

## 2020-11-25 DIAGNOSIS — T1490XA Injury, unspecified, initial encounter: Secondary | ICD-10-CM

## 2020-12-14 ENCOUNTER — Encounter (HOSPITAL_BASED_OUTPATIENT_CLINIC_OR_DEPARTMENT_OTHER): Payer: Self-pay

## 2020-12-14 ENCOUNTER — Other Ambulatory Visit: Payer: Self-pay

## 2020-12-14 ENCOUNTER — Ambulatory Visit (HOSPITAL_BASED_OUTPATIENT_CLINIC_OR_DEPARTMENT_OTHER)
Admission: RE | Admit: 2020-12-14 | Discharge: 2020-12-14 | Disposition: A | Discharge status: Home / Self Care | Payer: BC Managed Care – PPO | Source: Ambulatory Visit | Attending: Family Medicine | Admitting: Family Medicine

## 2020-12-14 DIAGNOSIS — Z1231 Encounter for screening mammogram for malignant neoplasm of breast: Secondary | ICD-10-CM | POA: Insufficient documentation

## 2021-11-02 ENCOUNTER — Other Ambulatory Visit (HOSPITAL_BASED_OUTPATIENT_CLINIC_OR_DEPARTMENT_OTHER): Payer: Self-pay | Admitting: Family Medicine

## 2021-11-02 DIAGNOSIS — Z1231 Encounter for screening mammogram for malignant neoplasm of breast: Secondary | ICD-10-CM

## 2021-12-19 ENCOUNTER — Encounter (HOSPITAL_BASED_OUTPATIENT_CLINIC_OR_DEPARTMENT_OTHER): Payer: Self-pay

## 2021-12-19 ENCOUNTER — Ambulatory Visit (HOSPITAL_BASED_OUTPATIENT_CLINIC_OR_DEPARTMENT_OTHER)
Admission: RE | Admit: 2021-12-19 | Discharge: 2021-12-19 | Disposition: A | Discharge status: Home / Self Care | Payer: BC Managed Care – PPO | Source: Ambulatory Visit | Attending: Family Medicine | Admitting: Family Medicine

## 2021-12-19 DIAGNOSIS — Z1231 Encounter for screening mammogram for malignant neoplasm of breast: Secondary | ICD-10-CM | POA: Insufficient documentation

## 2022-02-14 IMAGING — MG MM DIGITAL SCREENING BILAT W/ TOMO AND CAD
8 series · 8 of 24 positions shown · non-contrast
Comparison: Previous exam(s).

CLINICAL DATA: Screening.

EXAM:
DIGITAL SCREENING BILATERAL MAMMOGRAM WITH TOMOSYNTHESIS AND CAD
TECHNIQUE: Bilateral screening digital craniocaudal and mediolateral oblique
mammograms were obtained. Bilateral screening digital breast
tomosynthesis was performed. The images were evaluated with
computer-aided detection.

[R MLO synth-2D]
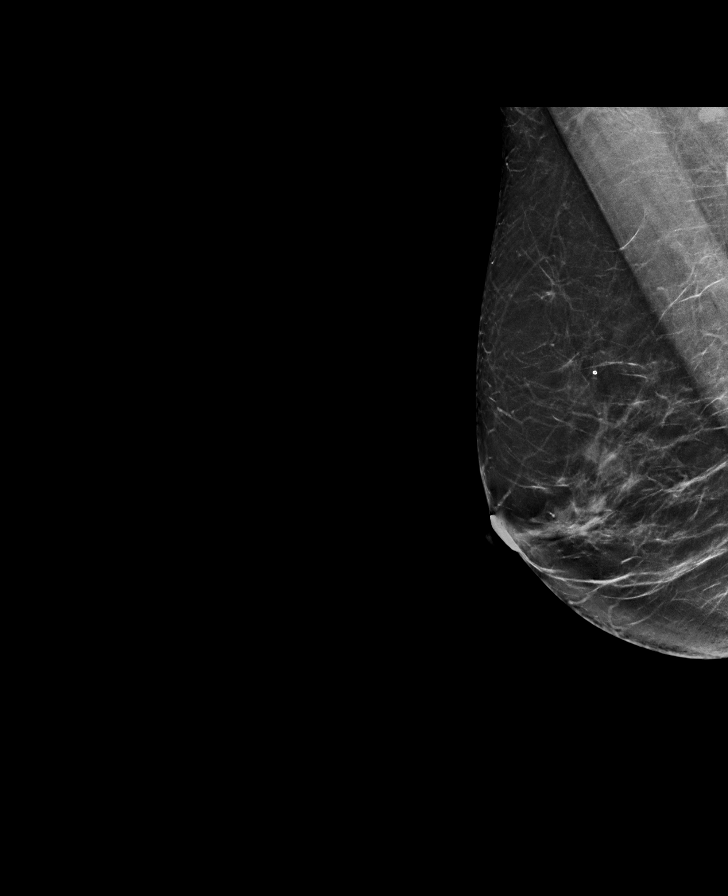

[R CC synth-2D]
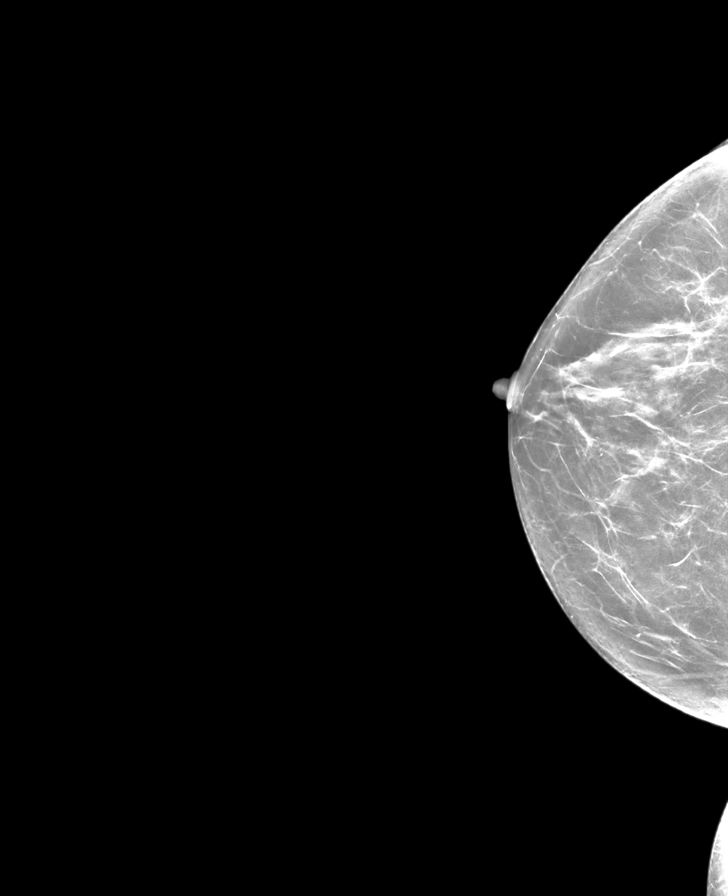

[L MLO synth-2D]
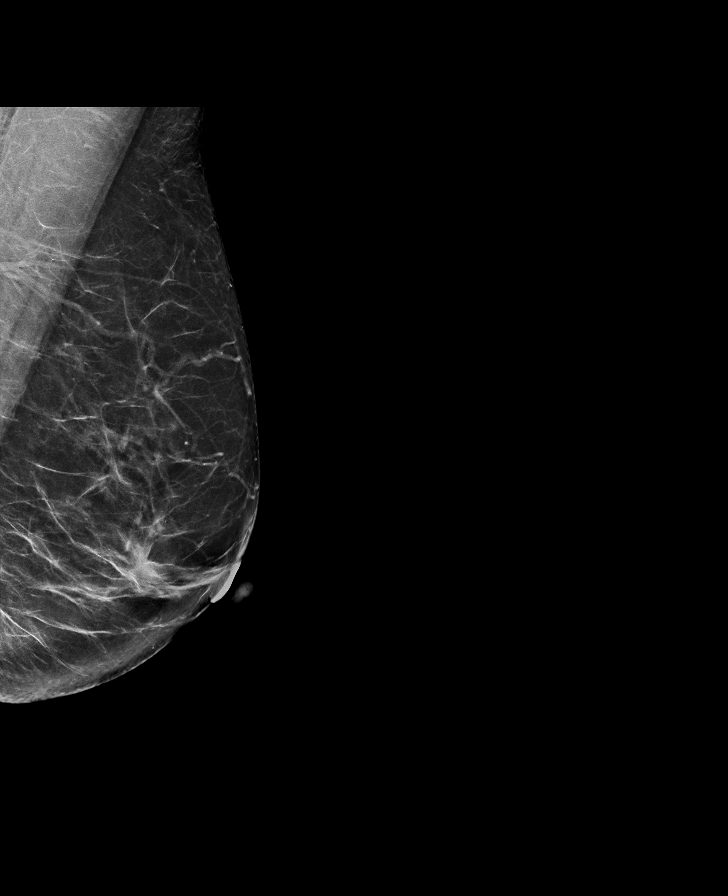

[L CC synth-2D]
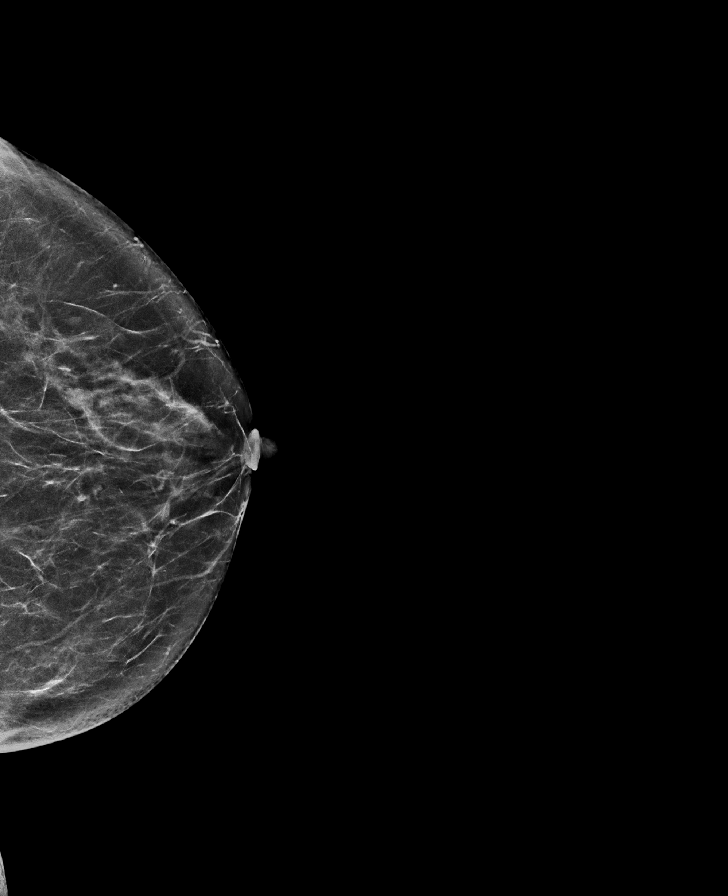

[L MLO tomo · tomo slice 37/74.0]
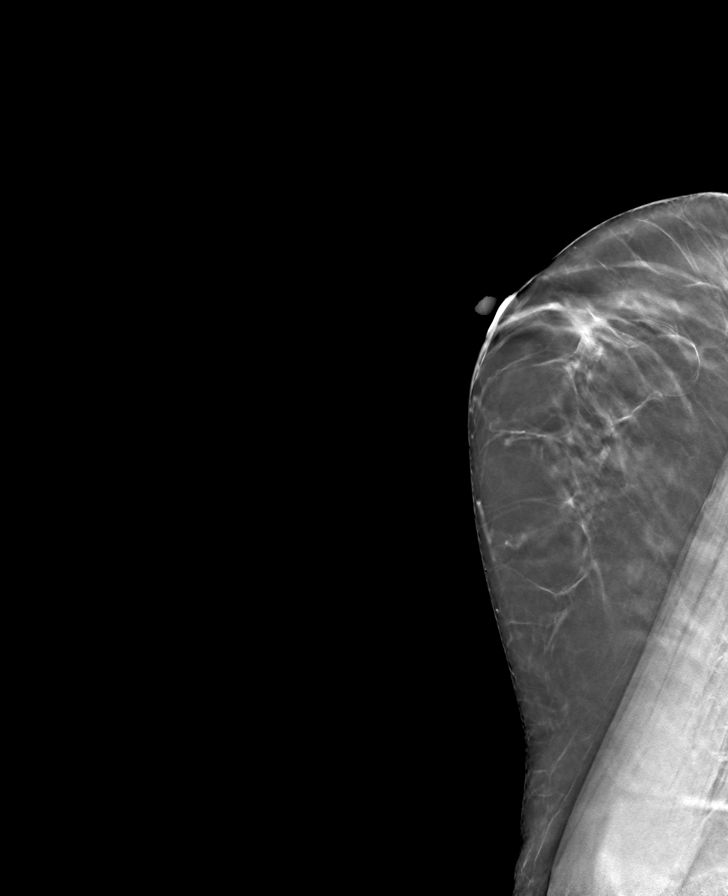

[R MLO tomo · tomo slice 36/71.0]
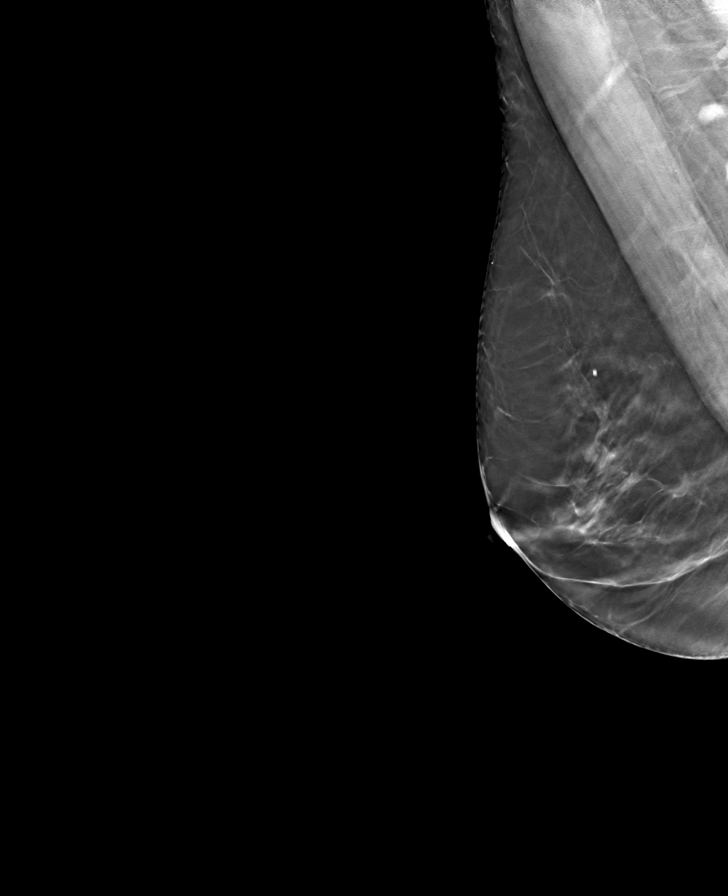

[R CC tomo · tomo slice 35/68.0]
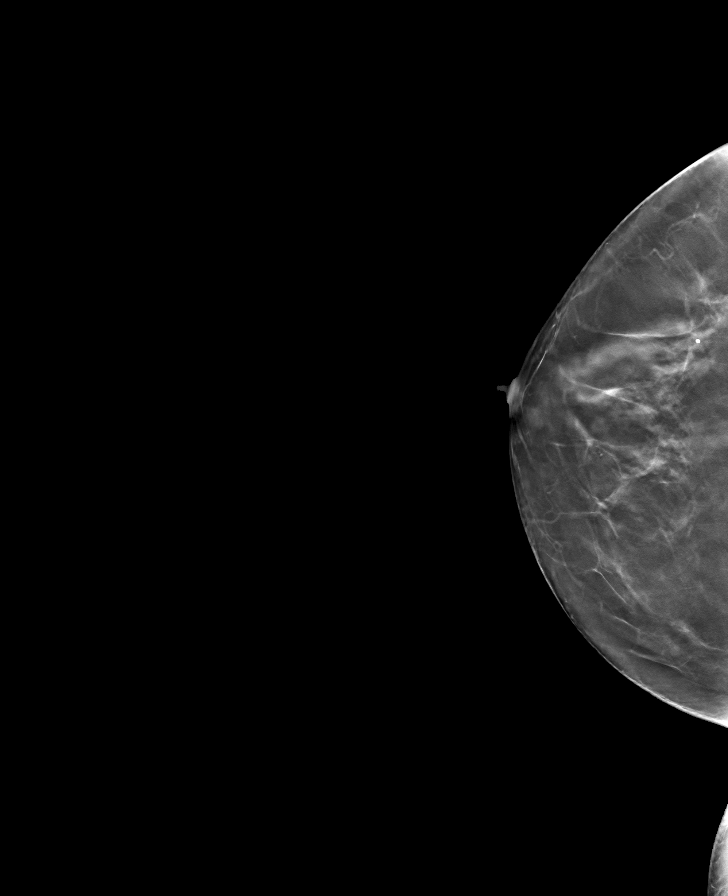

[L CC tomo · tomo slice 34/67.0]
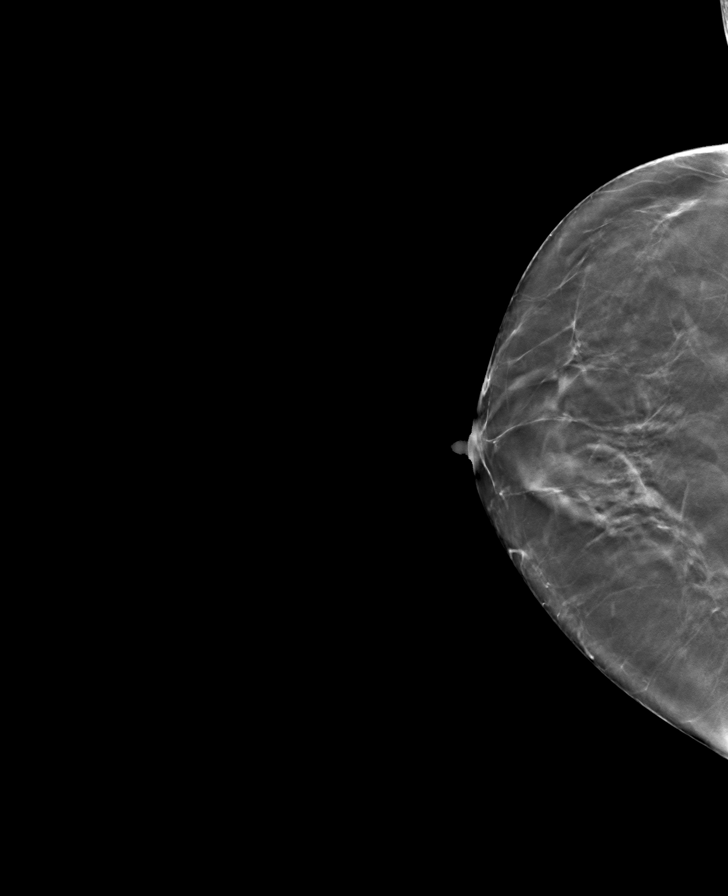

[8 of 24 positions shown; findings below may reference images not displayed]

ACR Breast Density Category b: There are scattered areas of
fibroglandular density.
FINDINGS: There are no findings suspicious for malignancy.
IMPRESSION: No mammographic evidence of malignancy. A result letter of this
screening mammogram will be mailed directly to the patient.

RECOMMENDATION:
Screening mammogram in one year. (Code:51-O-LD2)

BI-RADS CATEGORY  1: Negative.

## 2022-11-16 ENCOUNTER — Other Ambulatory Visit (HOSPITAL_BASED_OUTPATIENT_CLINIC_OR_DEPARTMENT_OTHER): Payer: Self-pay | Admitting: Family Medicine

## 2022-11-16 DIAGNOSIS — Z139 Encounter for screening, unspecified: Secondary | ICD-10-CM

## 2022-12-26 ENCOUNTER — Encounter (HOSPITAL_BASED_OUTPATIENT_CLINIC_OR_DEPARTMENT_OTHER): Payer: Self-pay

## 2022-12-26 ENCOUNTER — Ambulatory Visit (HOSPITAL_BASED_OUTPATIENT_CLINIC_OR_DEPARTMENT_OTHER)
Admission: RE | Admit: 2022-12-26 | Discharge: 2022-12-26 | Disposition: A | Discharge status: Home / Self Care | Payer: BC Managed Care – PPO | Source: Ambulatory Visit | Attending: Family Medicine | Admitting: Family Medicine

## 2022-12-26 DIAGNOSIS — Z1231 Encounter for screening mammogram for malignant neoplasm of breast: Secondary | ICD-10-CM | POA: Insufficient documentation

## 2022-12-26 DIAGNOSIS — Z139 Encounter for screening, unspecified: Secondary | ICD-10-CM

## 2023-12-11 ENCOUNTER — Other Ambulatory Visit (HOSPITAL_BASED_OUTPATIENT_CLINIC_OR_DEPARTMENT_OTHER): Payer: Self-pay | Admitting: Family Medicine

## 2023-12-11 DIAGNOSIS — Z1231 Encounter for screening mammogram for malignant neoplasm of breast: Secondary | ICD-10-CM

## 2023-12-27 ENCOUNTER — Inpatient Hospital Stay (HOSPITAL_BASED_OUTPATIENT_CLINIC_OR_DEPARTMENT_OTHER)
Admission: RE | Admit: 2023-12-27 | Discharge: 2023-12-27 | Disposition: A | Source: Ambulatory Visit | Attending: Family Medicine | Admitting: Family Medicine

## 2023-12-27 ENCOUNTER — Encounter (HOSPITAL_BASED_OUTPATIENT_CLINIC_OR_DEPARTMENT_OTHER): Payer: Self-pay

## 2023-12-27 DIAGNOSIS — Z1231 Encounter for screening mammogram for malignant neoplasm of breast: Secondary | ICD-10-CM | POA: Insufficient documentation

## 2024-01-10 ENCOUNTER — Other Ambulatory Visit: Payer: Self-pay | Admitting: Family Medicine

## 2024-01-10 DIAGNOSIS — R928 Other abnormal and inconclusive findings on diagnostic imaging of breast: Secondary | ICD-10-CM

## 2024-01-28 ENCOUNTER — Ambulatory Visit
Admission: RE | Admit: 2024-01-28 | Discharge: 2024-01-28 | Disposition: A | Discharge status: Home / Self Care | Source: Ambulatory Visit | Attending: Family Medicine | Admitting: Family Medicine

## 2024-01-28 ENCOUNTER — Other Ambulatory Visit: Payer: Self-pay | Admitting: Family Medicine

## 2024-01-28 ENCOUNTER — Encounter: Payer: Self-pay | Admitting: Family Medicine

## 2024-01-28 DIAGNOSIS — N6312 Unspecified lump in the right breast, upper inner quadrant: Secondary | ICD-10-CM

## 2024-01-28 DIAGNOSIS — R928 Other abnormal and inconclusive findings on diagnostic imaging of breast: Secondary | ICD-10-CM

## 2024-07-29 ENCOUNTER — Other Ambulatory Visit
